# Patient Record
Sex: Male | Born: 1965 | Race: White | Hispanic: No | Marital: Married | Smoking: Never smoker
Health system: Southern US, Community
[De-identification: ages and names within clinical notes are randomized; demographics above are authoritative.]

## PROBLEM LIST (undated history)

## (undated) HISTORY — PX: TOTAL HIP ARTHROPLASTY: SHX124

---

## 2017-01-09 ENCOUNTER — Ambulatory Visit (INDEPENDENT_AMBULATORY_CARE_PROVIDER_SITE_OTHER): Payer: BLUE CROSS/BLUE SHIELD

## 2017-01-09 ENCOUNTER — Encounter: Payer: Self-pay | Admitting: Podiatry

## 2017-01-09 ENCOUNTER — Ambulatory Visit (INDEPENDENT_AMBULATORY_CARE_PROVIDER_SITE_OTHER): Payer: BLUE CROSS/BLUE SHIELD | Admitting: Podiatry

## 2017-01-09 VITALS — BP 112/77 | HR 59 | Resp 16 | Ht 68.0 in | Wt 200.0 lb

## 2017-01-09 DIAGNOSIS — M722 Plantar fascial fibromatosis: Secondary | ICD-10-CM | POA: Diagnosis not present

## 2017-01-09 DIAGNOSIS — M79671 Pain in right foot: Secondary | ICD-10-CM

## 2017-01-09 DIAGNOSIS — M79672 Pain in left foot: Secondary | ICD-10-CM | POA: Diagnosis not present

## 2017-01-09 DIAGNOSIS — M779 Enthesopathy, unspecified: Secondary | ICD-10-CM | POA: Diagnosis not present

## 2017-01-09 MED ORDER — TRIAMCINOLONE ACETONIDE 10 MG/ML IJ SUSP
10.0000 mg | Freq: Once | INTRAMUSCULAR | Status: AC
  Administered 2017-01-09: 10 mg

## 2017-01-09 MED ORDER — PREDNISONE 10 MG PO TABS: ORAL_TABLET | ORAL | 0 refills | Status: AC

## 2017-01-09 NOTE — Progress Notes (Signed)
   Subjective:    Patient ID: Benjamin Floyd, male    DOB: [birth year], 51 y.o.   MRN: [id number]  HPI Chief Complaint  Patient presents with  . Foot Pain    Bilateral; plantar; pt stated, "Entire bottom of feet have hurt for the past 6-9 months"  . Foot Orthotics    Pt wants to discuss if they need to get      Review of Systems  Musculoskeletal: Positive for arthralgias, back pain, gait problem and myalgias.  Psychiatric/Behavioral: The patient is nervous/anxious.   All other systems reviewed and are negative.      Objective:   Physical Exam        Assessment & Plan:

## 2017-01-09 NOTE — Patient Instructions (Signed)

## 2017-01-12 NOTE — Progress Notes (Signed)
Subjective:     Patient ID: Benjamin Floyd, male   DOB: 1966/03/13, 51 y.o.   MRN: [id number]  HPI patient presents with long-term pain in his feet was worsening of heel pain over the last several months with history of condition and has orthotics which are no longer effective for him   Review of Systems  All other systems reviewed and are negative.      Objective:   Physical Exam  Constitutional: He is oriented to person, place, and time.  Cardiovascular: Intact distal pulses.   Musculoskeletal: Normal range of motion.  Neurological: He is oriented to person, place, and time.  Skin: Skin is warm.  Nursing note and vitals reviewed.  neurovascular status found to be intact muscle strength is adequate range of motion within normal limits with patient noted to have exquisite discomfort in the plantar heel bilateral feet and does have forefoot discomfort present if he walks too much but it appears to be coming from the heel more than anything else. I did note depression of the arch and patient is found to have good digital perfusion and is well oriented 3     Assessment:     Plantar fasciitis bilateral heel with inflammation and fluid around the medial band    Plan:     H&P conditions reviewed and injected the plantar fascia bilateral 3 mg Kenalog 5 mg Xylocaine slight fascial brace with instructions on usage. Patient also was given instructions on physical therapy and was placed on a 12 a steroid treatment due to the discomfort that is entirely in his feet and the fact we're only addressing one area. Discussed long-term orthotics  X-ray report indicated spur formation with moderate depression of the arch

## 2017-01-23 ENCOUNTER — Ambulatory Visit: Payer: BLUE CROSS/BLUE SHIELD | Admitting: Podiatry

## 2019-12-03 ENCOUNTER — Other Ambulatory Visit: Payer: Self-pay

## 2019-12-03 ENCOUNTER — Emergency Department (HOSPITAL_BASED_OUTPATIENT_CLINIC_OR_DEPARTMENT_OTHER)
Admission: EM | Admit: 2019-12-03 | Discharge: 2019-12-03 | Disposition: A | Discharge status: Home / Self Care | Payer: BC Managed Care – PPO | Attending: Emergency Medicine | Admitting: Emergency Medicine

## 2019-12-03 ENCOUNTER — Emergency Department (HOSPITAL_BASED_OUTPATIENT_CLINIC_OR_DEPARTMENT_OTHER): Payer: BC Managed Care – PPO

## 2019-12-03 ENCOUNTER — Encounter (HOSPITAL_BASED_OUTPATIENT_CLINIC_OR_DEPARTMENT_OTHER): Payer: Self-pay

## 2019-12-03 DIAGNOSIS — B9789 Other viral agents as the cause of diseases classified elsewhere: Secondary | ICD-10-CM

## 2019-12-03 DIAGNOSIS — R0602 Shortness of breath: Secondary | ICD-10-CM | POA: Insufficient documentation

## 2019-12-03 DIAGNOSIS — Z79899 Other long term (current) drug therapy: Secondary | ICD-10-CM | POA: Diagnosis not present

## 2019-12-03 DIAGNOSIS — Z20828 Contact with and (suspected) exposure to other viral communicable diseases: Secondary | ICD-10-CM | POA: Diagnosis not present

## 2019-12-03 DIAGNOSIS — J988 Other specified respiratory disorders: Secondary | ICD-10-CM

## 2019-12-03 DIAGNOSIS — J069 Acute upper respiratory infection, unspecified: Secondary | ICD-10-CM | POA: Insufficient documentation

## 2019-12-03 LAB — CBC WITH DIFFERENTIAL/PLATELET
Abs Immature Granulocytes: 0.01 10*3/uL (ref 0.00–0.07)
Basophils Absolute: 0 10*3/uL (ref 0.0–0.1)
Basophils Relative: 0 %
Eosinophils Absolute: 0.1 10*3/uL (ref 0.0–0.5)
Eosinophils Relative: 2 %
HCT: 47.8 % (ref 39.0–52.0)
Hemoglobin: 15.4 g/dL (ref 13.0–17.0)
Immature Granulocytes: 0 %
Lymphocytes Relative: 26 %
Lymphs Abs: 1.8 10*3/uL (ref 0.7–4.0)
MCH: 31.2 pg (ref 26.0–34.0)
MCHC: 32.2 g/dL (ref 30.0–36.0)
MCV: 97 fL (ref 80.0–100.0)
Monocytes Absolute: 0.4 10*3/uL (ref 0.1–1.0)
Monocytes Relative: 6 %
Neutro Abs: 4.4 10*3/uL (ref 1.7–7.7)
Neutrophils Relative %: 66 %
Platelets: 215 10*3/uL (ref 150–400)
RBC: 4.93 MIL/uL (ref 4.22–5.81)
RDW: 11.8 % (ref 11.5–15.5)
WBC: 6.7 10*3/uL (ref 4.0–10.5)
nRBC: 0 % (ref 0.0–0.2)

## 2019-12-03 LAB — BASIC METABOLIC PANEL
Anion gap: 8 (ref 5–15)
BUN: 13 mg/dL (ref 6–20)
CO2: 30 mmol/L (ref 22–32)
Calcium: 9.1 mg/dL (ref 8.9–10.3)
Chloride: 102 mmol/L (ref 98–111)
Creatinine, Ser: 0.96 mg/dL (ref 0.61–1.24)
GFR calc Af Amer: >60 mL/min (ref >60)
GFR calc non Af Amer: >60 mL/min (ref >60)
Glucose, Bld: 85 mg/dL (ref 70–99)
Potassium: 4.1 mmol/L (ref 3.5–5.1)
Sodium: 140 mmol/L (ref 135–145)

## 2019-12-03 NOTE — Discharge Instructions (Signed)
Please read and follow all provided instructions.  Your diagnoses today include:  1. Shortness of breath   2. Viral respiratory infection     You appear to have an upper respiratory infection (URI). An upper respiratory tract infection, or cold, is a viral infection of the air passages leading to the lungs. It should improve gradually after 5-7 days. You may have a lingering cough that lasts for 2- 4 weeks after the infection.  Tests performed today include:  Vital signs. See below for your results today.   Chest x-ray -no signs of pneumonia  EKG -no signs of stress on the heart or other problems  Coronavirus test -pending  Blood test for blood cell counts and electrolytes -were normal  Medications prescribed:   None  Take any prescribed medications only as directed. Treatment for your infection is aimed at treating the symptoms. There are no medications, such as antibiotics, that will cure your infection.   Home care instructions:  Follow any educational materials contained in this packet.   Your illness is contagious and can be spread to others, especially during the first 3 or 4 days. It cannot be cured by antibiotics or other medicines. Take basic precautions such as washing your hands often, covering your mouth when you cough or sneeze, and avoiding public places where you could spread your illness to others.   Please continue drinking plenty of fluids.  Use over-the-counter medicines as needed as directed on packaging for symptom relief.  You may also use ibuprofen or tylenol as directed on packaging for pain or fever.  Do not take multiple medicines containing Tylenol or acetaminophen to avoid taking too much of this medication.  Follow-up instructions: Please follow-up with your primary care provider as needed for further evaluation of your symptoms if you are not feeling better.   Return instructions:   Please return to the Emergency Department if you experience  worsening symptoms.   RETURN IMMEDIATELY IF you develop shortness of breath, confusion or altered mental status, a new rash, become dizzy, faint, or poorly responsive, or are unable to be cared for at home.  Please return if you have persistent vomiting and cannot keep down fluids or develop a fever that is not controlled by tylenol or motrin.    Please return if you have any other emergent concerns.  Additional Information:  Your vital signs today were: BP 124/73 (BP Location: Right Arm)    Pulse 69    Temp 98.4 F (36.9 C) (Oral)    Resp 20    Ht 5\' 8"  (1.727 m)    Wt 86.2 kg    SpO2 98%    BMI 28.89 kg/m  If your blood pressure (BP) was elevated above 135/85 this visit, please have this repeated by your doctor within one month. --------------

## 2019-12-03 NOTE — ED Triage Notes (Signed)
Pt reports that he has been having SOB with headaches and fatigue. Reports that the children in his home have had exposure to Covid at school but have not been tested or had any symptoms.

## 2019-12-03 NOTE — ED Provider Notes (Signed)
[facility] EMERGENCY DEPARTMENT Provider Note   CSN: [id number] Arrival date & time: 12/03/19  1004     History   Chief Complaint Chief Complaint  Patient presents with   Shortness of Breath    HPI Benjamin Floyd is a 53 y.o. male.     Patient with history of bilateral hip replacements, borderline cholesterol not on medications--presents to the emergency department today with complaint of generalized fatigue and weakness with associated shortness of breath and decreased appetite over the past 3 to 4 days.  Patient has had a mild cough productive of green sputum and some mild nasal congestion however this is not pronounced.  He states generalized shortness of breath that is not worse with activity.  No chest pain.  He reports decreased appetite but no nausea or vomiting.  No change in taste or smell since.  No abdominal pain.  No diarrhea or constipation.  No urinary symptoms or skin rashes.  Patient states that his children who live at home were exposed to coronavirus at their school and are currently on a 14-day quarantine.  They have not been tested.  Patient denies other sick contacts.  No cardiac or pulmonary history.  Patient is a [employer]selling [employer] and non-smoker.  Onset of symptoms acute.  Course is constant. Patient denies risk factors for pulmonary embolism including: unilateral leg swelling, history of DVT/PE/other blood clots, use of exogenous hormones, recent immobilizations, recent surgery, recent travel (>4hr segment), malignancy, hemoptysis.       History reviewed. No pertinent past medical history.  There are no active problems to display for this patient.   Past Surgical History:  Procedure Laterality Date   TOTAL HIP ARTHROPLASTY Bilateral         Home Medications    Prior to Admission medications   Medication Sig Start Date End Date Taking? Authorizing Provider  clonazePAM (KLONOPIN) 1 MG tablet Take 1 tab tid prn 08/27/17  Yes [provider]    clonazePAM (KLONOPIN) 1 MG tablet Take 1 mg by mouth.    [provider]  cyclobenzaprine (FLEXERIL) 10 MG tablet Take 10 mg by mouth as needed.     [provider]  desvenlafaxine (PRISTIQ) 100 MG 24 hr tablet Take 100 mg by mouth daily. 11/26/19   [provider]  diclofenac sodium (VOLTAREN) 1 % GEL Apply 2 g topically as needed.     [provider]  HYDROcodone-acetaminophen (NORCO) 7.5-325 MG tablet 1T' BY MOUTH EVERY 6 HOURS AS NEEDED FOR PAIN 02/23/16   [provider]  HYDROcodone-acetaminophen (NORCO/VICODIN) 5-325 MG tablet Take 1 tablet by mouth 3 (three) times daily. 07/16/19   [provider]  predniSONE (DELTASONE) 10 MG tablet 12 day tapering dose 01/09/17   Lenn Sinkegal, Norman S, DPM  zolpidem (AMBIEN) 10 MG tablet Take 10 mg by mouth.    [provider]    Family History History reviewed. No pertinent family history.  Social History Social History   Tobacco Use   Smoking status: Never Smoker   Smokeless tobacco: Never Used  Substance Use Topics   Alcohol use: Never    Frequency: Never   Drug use: Never     Allergies   Patient has no known allergies.   Review of Systems Review of Systems  Constitutional: Positive for activity change, appetite change and fatigue. Negative for fever.  HENT: Positive for congestion (Mild). Negative for rhinorrhea and sore throat.   Eyes: Negative for redness.  Respiratory: Positive for cough (Minimal).  Negative for shortness of breath.   Cardiovascular: Negative for chest pain.  Gastrointestinal: Negative for abdominal pain, diarrhea, nausea and vomiting.  Genitourinary: Negative for dysuria.  Musculoskeletal: Negative for myalgias.  Skin: Negative for rash.  Neurological: Positive for headaches.     Physical Exam Updated Vital Signs BP 124/73 (BP Location: Right Arm)    Pulse 69    Temp 98.4 F (36.9 C) (Oral)    Resp 20    Ht 5\' 8"  (1.727 m)    Wt 86.2 kg     SpO2 96%    BMI 28.89 kg/m   Physical Exam Vitals signs and nursing note reviewed.  Constitutional:      Appearance: He is well-developed.  HENT:     Head: Normocephalic and atraumatic.  Eyes:     General:        Right eye: No discharge.        Left eye: No discharge.     Conjunctiva/sclera: Conjunctivae normal.  Neck:     Musculoskeletal: Normal range of motion and neck supple.  Cardiovascular:     Rate and Rhythm: Normal rate and regular rhythm.     Heart sounds: Normal heart sounds.  Pulmonary:     Effort: Pulmonary effort is normal.     Breath sounds: Normal breath sounds.  Abdominal:     Palpations: Abdomen is soft.     Tenderness: There is no abdominal tenderness.  Musculoskeletal:     Right lower leg: He exhibits no tenderness. No edema.     Left lower leg: He exhibits no tenderness. No edema.  Skin:    General: Skin is warm and dry.  Neurological:     Mental Status: He is alert.      ED Treatments / Results  Labs (all labs ordered are listed, but only abnormal results are displayed) Labs Reviewed  SARS CORONAVIRUS 2 (TAT 6-24 HRS)  CBC WITH DIFFERENTIAL/PLATELET  BASIC METABOLIC PANEL    EKG EKG Interpretation  Date/Time:  Friday December 03 2019 11:50:18 EST Ventricular Rate:  59 PR Interval:    QRS Duration: 103 QT Interval:  404 QTC Calculation: 401 R Axis:   66 Text Interpretation: Sinus rhythm No old tracing to compare Confirmed by [id number] [phone]) on 12/03/2019 11:58:22 AM   Radiology Dg Chest Port 1 View  Result Date: 12/03/2019 CLINICAL DATA:  Shortness of breath, headaches and fatigue. EXAM: PORTABLE CHEST 1 VIEW COMPARISON:  None. FINDINGS: Lungs clear. Heart size normal. No pneumothorax or pleural fluid. No bony abnormality. IMPRESSION: Negative chest. Electronically Signed   By: 14/03/2019 M.D.   On: 12/03/2019 10:59    Procedures Procedures (including critical care time)  Medications Ordered in ED Medications - No  data to display   Initial Impression / Assessment and Plan / ED Course  I have reviewed the triage vital signs and the nursing notes.  Pertinent labs & imaging results that were available during my care of the patient were reviewed by me and considered in my medical decision making (see chart for details).        Patient seen and examined. Work-up initiated. Patient looks well.  Oxygen level between 94% and 98% while conversing.  Labs, chest x-ray, EKG, coronavirus testing ordered.  Doubt ACS or PE given the patient's history and exam.  Vital signs reviewed and are as follows: BP 124/73 (BP Location: Right Arm)    Pulse 69    Temp 98.4 F (36.9 C) (Oral)  Resp 20    Ht 5\' 8"  (1.727 m)    Wt 86.2 kg    SpO2 96%    BMI 28.89 kg/m   1:00 PM lab work-up is reassuring.  Patient ambulated with normal pulse oximetry.  No indication for further work-up or admission at this time.  Patient be discharged home with pending coronavirus testing.  We discussed isolation until test returns.    Encouraged return to the emergency department with worsening symptoms, shortness of breath, new symptoms such as chest pain, persistent vomiting, high fevers.  Patient verbalizes understanding and agrees with plan.  Final Clinical Impressions(s) / ED Diagnoses   Final diagnoses:  Shortness of breath  Viral respiratory infection   Patient with mild respiratory symptoms including shortness of breath, concern for coronavirus contacts potentially.  EKG and chest x-ray are normal.  Patient is not hypoxic at rest or with on ambulation.  He looks very well without any increased work of breathing or accessory muscle use.  No clinical findings suggestive of ACS or PE/DVT.  Coronavirus testing pending.  Patient stable for discharged home at this time with return instructions.  ED Discharge Orders    None       Carlisle Cater, Hershal Coria 12/03/19 1301    Hayden Rasmussen, MD 12/03/19 Jeri Lager

## 2019-12-04 LAB — SARS CORONAVIRUS 2 (TAT 6-24 HRS): SARS Coronavirus 2: NEGATIVE

## 2020-12-12 IMAGING — DX DG CHEST 1V PORT
1 series · 1 of 1 positions shown · non-contrast
Comparison: None.

CLINICAL DATA: Shortness of breath, headaches and fatigue.

EXAM:
PORTABLE CHEST 1 VIEW

[chest ap]
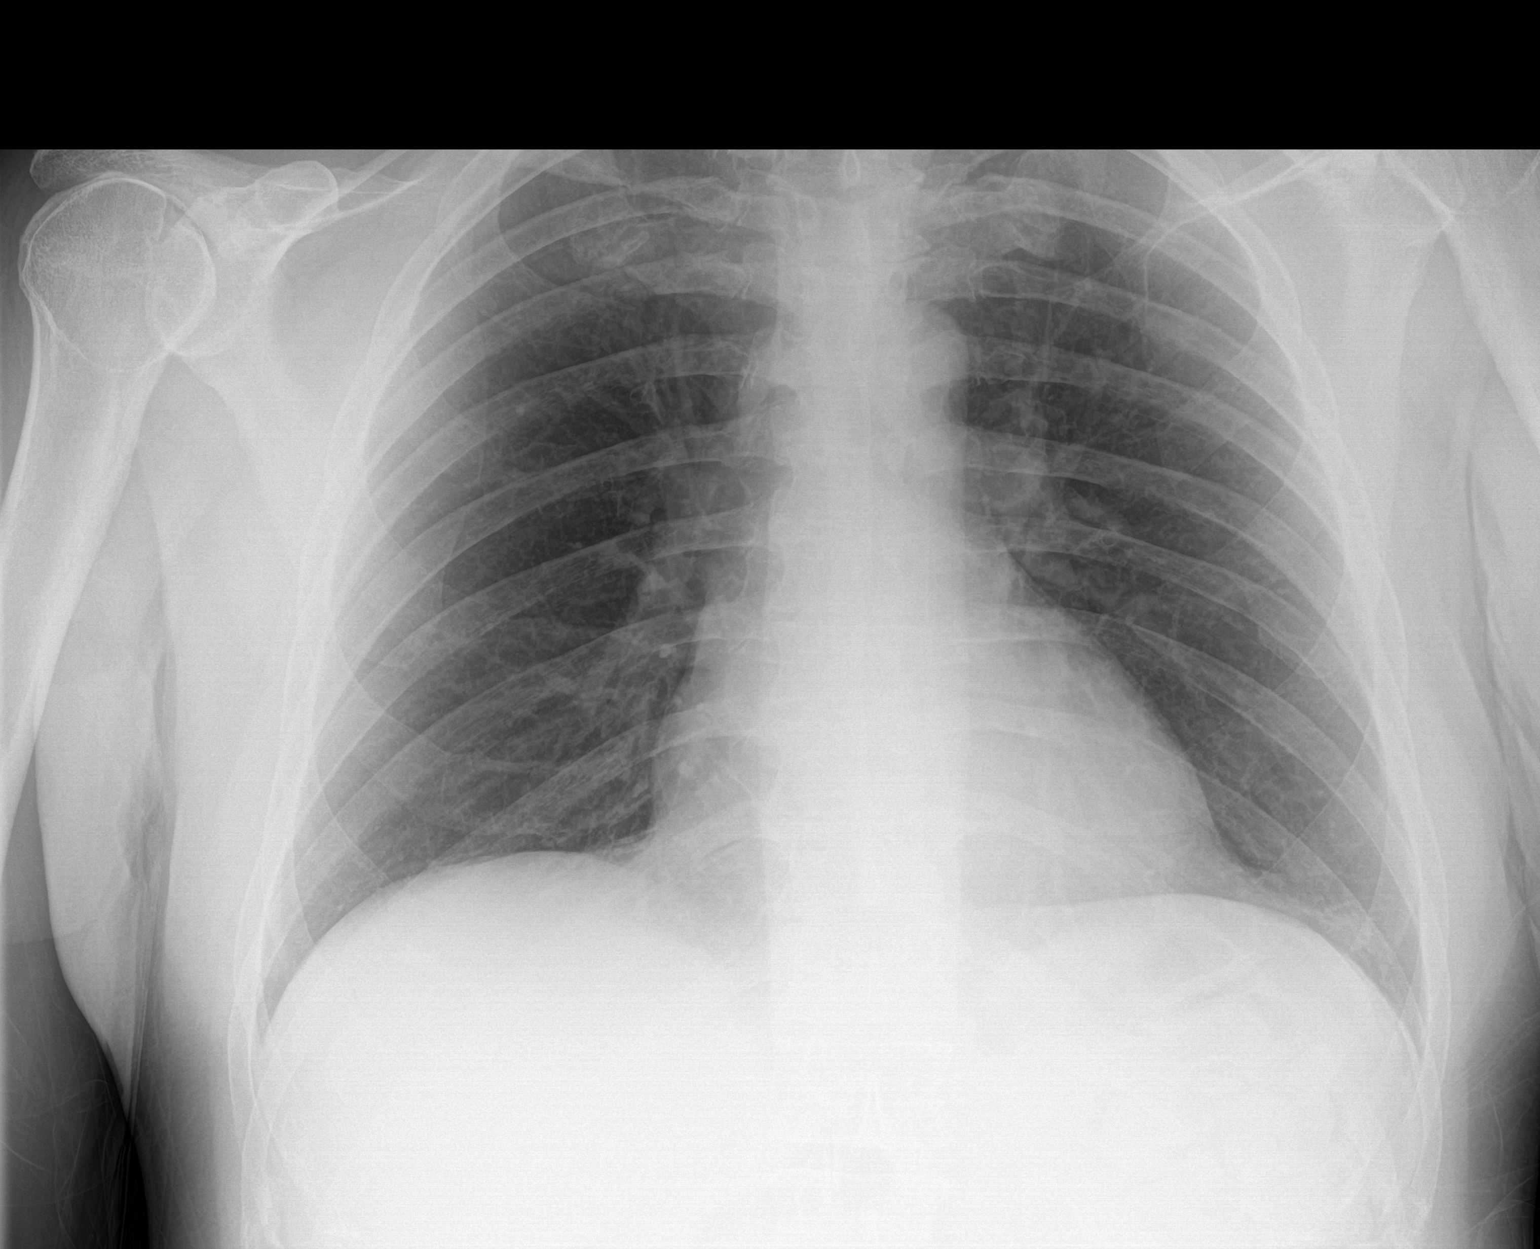

[1 of 1 positions shown; findings below may reference images not displayed]

FINDINGS: Lungs clear. Heart size normal. No pneumothorax or pleural fluid. No
bony abnormality.
IMPRESSION: Negative chest.
